# Patient Record
Sex: Female | Born: 1965 | Race: White | Hispanic: No | Marital: Single
Health system: Southern US, Community
[De-identification: ages and names within clinical notes are randomized; demographics above are authoritative.]

---

## 2019-10-13 ENCOUNTER — Emergency Department (HOSPITAL_COMMUNITY)
Admission: EM | Admit: 2019-10-13 | Discharge: 2019-10-13 | Disposition: A | Discharge status: Home / Self Care | Payer: Medicaid - Out of State | Attending: Emergency Medicine | Admitting: Emergency Medicine

## 2019-10-13 ENCOUNTER — Emergency Department (HOSPITAL_COMMUNITY): Payer: Medicaid - Out of State

## 2019-10-13 ENCOUNTER — Encounter (HOSPITAL_COMMUNITY): Payer: Self-pay | Admitting: Student

## 2019-10-13 ENCOUNTER — Other Ambulatory Visit: Payer: Self-pay

## 2019-10-13 DIAGNOSIS — Q612 Polycystic kidney, adult type: Secondary | ICD-10-CM | POA: Diagnosis not present

## 2019-10-13 DIAGNOSIS — I2510 Atherosclerotic heart disease of native coronary artery without angina pectoris: Secondary | ICD-10-CM | POA: Insufficient documentation

## 2019-10-13 DIAGNOSIS — R079 Chest pain, unspecified: Secondary | ICD-10-CM

## 2019-10-13 DIAGNOSIS — R0789 Other chest pain: Secondary | ICD-10-CM | POA: Diagnosis not present

## 2019-10-13 LAB — CBC
HCT: 45.6 % (ref 36.0–46.0)
Hemoglobin: 15.1 g/dL — ABNORMAL HIGH (ref 12.0–15.0)
MCH: 31.3 pg (ref 26.0–34.0)
MCHC: 33.1 g/dL (ref 30.0–36.0)
MCV: 94.6 fL (ref 80.0–100.0)
Platelets: 158 10*3/uL (ref 150–400)
RBC: 4.82 MIL/uL (ref 3.87–5.11)
RDW: 14.1 % (ref 11.5–15.5)
WBC: 5.4 10*3/uL (ref 4.0–10.5)
nRBC: 0 % (ref 0.0–0.2)

## 2019-10-13 LAB — BASIC METABOLIC PANEL
Anion gap: 10 (ref 5–15)
BUN: 22 mg/dL — ABNORMAL HIGH (ref 6–20)
CO2: 25 mmol/L (ref 22–32)
Calcium: 9.8 mg/dL (ref 8.9–10.3)
Chloride: 106 mmol/L (ref 98–111)
Creatinine, Ser: 0.99 mg/dL (ref 0.44–1.00)
Glucose, Bld: 116 mg/dL — ABNORMAL HIGH (ref 70–99)
Potassium: 3.9 mmol/L (ref 3.5–5.1)
Sodium: 141 mmol/L (ref 135–145)

## 2019-10-13 LAB — I-STAT BETA HCG BLOOD, ED (MC, WL, AP ONLY)
I-stat hCG, quantitative: 18.1 m[IU]/mL — ABNORMAL HIGH (ref <5)
I-stat hCG, quantitative: 24.6 m[IU]/mL — ABNORMAL HIGH (ref <5)

## 2019-10-13 LAB — TROPONIN I (HIGH SENSITIVITY)
Troponin I (High Sensitivity): 3 ng/L (ref <18)
Troponin I (High Sensitivity): 3 ng/L (ref <18)

## 2019-10-13 MED ORDER — SODIUM CHLORIDE 0.9% FLUSH: 3.0000 mL | Freq: Once | INTRAVENOUS | Status: DC

## 2019-10-13 MED ORDER — METHOCARBAMOL 500 MG PO TABS
500.0000 mg | ORAL_TABLET | Freq: Three times a day (TID) | ORAL | 0 refills | Status: AC | PRN
Start: 2019-10-13 — End: ?

## 2019-10-13 MED ORDER — LIDOCAINE 5 % EX PTCH: 1.0000 | MEDICATED_PATCH | Freq: Every day | CUTANEOUS | 0 refills | Status: AC | PRN

## 2019-10-13 NOTE — ED Triage Notes (Addendum)
Pt presents to ED BIB [facility]. Pt c/o L CP that radiates to L arm. Pt reports that pain began at rest . Pt reports long trip on bus recently. EMS VSS, EMS gave 324 ASA and nitro x1. Pt reports she had "mild" heart attack in 2016.

## 2019-10-13 NOTE — Discharge Instructions (Addendum)
You were seen in the emergency department today for chest pain. Your work-up in the emergency department has been overall reassuring. Your labs have been fairly normal, your hcg was mildly elevated this can occur for a variety of reasons, this can be in pregnancy but we doubt this given your age at this time, please have this rechecked by your primary care provider. Your EKG and the enzyme we use to check your heart did not show an acute heart attack at this time. Your chest x-ray was normal.   We are sending you home with the following medicines:   - Naproxen is a nonsteroidal anti-inflammatory medication that will help with pain and swelling. Be sure to take this medication as prescribed with food, 1 pill every 12 hours,  It should be taken with food, as it can cause stomach upset, and more seriously, stomach bleeding. Do not take other nonsteroidal anti-inflammatory medications with this such as Advil, Motrin, Aleve, Mobic, Goodie Powder, or Motrin.    - Lidoderm patch- apply 1 patch to your area of most significant pain once per day, remove within 12 hours.  You make take Tylenol per over the counter dosing with these medications.   We have prescribed you new medication(s) today. Discuss the medications prescribed today with your pharmacist as they can have adverse effects and interactions with your other medicines including over the counter and prescribed medications. Seek medical evaluation if you start to experience new or abnormal symptoms after taking one of these medicines, seek care immediately if you start to experience difficulty breathing, feeling of your throat closing, facial swelling, or rash as these could be indications of a more serious allergic reaction   We would like you to follow up closely with your primary care provider and/or the cardiologist provided in your discharge instructions within 1-3 days. Return to the ER immediately should you experience any new or worsening symptoms  including but not limited to return of pain, worsened pain, vomiting, shortness of breath, dizziness, lightheadedness, passing out, or any other concerns that you may have.

## 2019-10-13 NOTE — ED Notes (Signed)
Pt pulled back for serial enzymes. Reassessed. States her pain is better but remains in left chest. Pinpoint pain. Not radiating. No nausea or vomiting. Skin w/d. Resp even and unlabored.

## 2019-10-13 NOTE — ED Provider Notes (Signed)
[facility] EMERGENCY DEPARTMENT Provider Note   CSN: [id number] Arrival date & time: 10/13/19  0133     History Chief Complaint  Patient presents with  . Chest Pain    Patricia Wolf is a 54 y.o. female with a history of CAD & polycystic kidney disease who presents to the ED with complaints of chest pain that began at midnight. Patient states pain is to the L chest, it is sharp/stabbing, with radiating into the L shoulder. Occurs intermittently, lasts a few minutes at a time. No alleviating/aggravating factors, no change with deep breath or exertion. Given Aspirin & NTG by EMS without much change. Denies nausea, vomiting, diaphoresis, syncope, dyspnea, leg pain/swelling, hemoptysis, recent surgery/trauma, hormone use, personal hx of cancer, or hx of DVT/PE. She was just on a bus for about 20 hours from [place] to [place]. States she had a "mini heart attack" in 2016, did not require stent placement.   HPI     No past medical history on file.  There are no problems to display for this patient.   History reviewed. No pertinent surgical history.   OB History   No obstetric history on file.     No family history on file.  Social History   Tobacco Use  . Smoking status: Not on file  Substance Use Topics  . Alcohol use: Not on file  . Drug use: Not on file    Home Medications Prior to Admission medications   Not on File    Allergies    Motrin [ibuprofen] and Tegretol [carbamazepine]  Review of Systems   Review of Systems  Constitutional: Negative for chills, diaphoresis and fever.  Respiratory: Negative for shortness of breath.   Cardiovascular: Positive for chest pain. Negative for palpitations and leg swelling.  Gastrointestinal: Negative for abdominal pain, nausea and vomiting.  Neurological: Negative for dizziness, speech difficulty, weakness and numbness.  All other systems reviewed and are negative.   Physical Exam Updated Vital Signs BP 125/89 (BP  Location: Right Arm)   Pulse 82   Temp 98.8 F (37.1 C) (Oral)   Resp 13   Ht 5\' 3"  (1.6 m)   Wt 77.1 kg   SpO2 99%   BMI 30.11 kg/m   Physical Exam Vitals and nursing note reviewed.  Constitutional:      General: She is not in acute distress.    Appearance: She is well-developed. She is not toxic-appearing.  HENT:     Head: Normocephalic and atraumatic.  Eyes:     General:        Right eye: No discharge.        Left eye: No discharge.     Conjunctiva/sclera: Conjunctivae normal.  Cardiovascular:     Rate and Rhythm: Normal rate and regular rhythm.     Pulses:          Radial pulses are 2+ on the right side and 2+ on the left side.       Dorsalis pedis pulses are 2+ on the right side and 2+ on the left side.       Posterior tibial pulses are 2+ on the right side and 2+ on the left side.  Pulmonary:     Effort: Pulmonary effort is normal. No respiratory distress.     Breath sounds: Normal breath sounds. No wheezing, rhonchi or rales.  Chest:     Chest wall: Tenderness (Left chest wall, reproduces patient's chest pain, no overlying skin changes.) present.  Abdominal:  General: There is no distension.     Palpations: Abdomen is soft.     Tenderness: There is no abdominal tenderness.  Musculoskeletal:     Cervical back: Neck supple.     Right lower leg: No tenderness. No edema.     Left lower leg: No tenderness. No edema.     Comments: Upper extremities: No edema, erythema, or warmth.  Intact active range of motion, patient has pain in her left chest with left shoulder flexion which reproduces her chest discomfort.  No focal bony tenderness.  Compartments are soft.  Skin:    General: Skin is warm and dry.     Findings: No rash.  Neurological:     Mental Status: She is alert.     Comments: Clear speech.   Psychiatric:        Behavior: Behavior normal.    ED Results / Procedures / Treatments   Labs (all labs ordered are listed, but only abnormal results are  displayed) Labs Reviewed  BASIC METABOLIC PANEL - Abnormal; Notable for the following components:      Result Value   Glucose, Bld 116 (*)    BUN 22 (*)    All other components within normal limits  CBC - Abnormal; Notable for the following components:   Hemoglobin 15.1 (*)    All other components within normal limits  I-STAT BETA HCG BLOOD, ED ([facility], [facility], [facility] ONLY) - Abnormal; Notable for the following components:   I-stat hCG, quantitative 18.1 (*)    All other components within normal limits  I-STAT BETA HCG BLOOD, ED ([facility], [facility], [facility] ONLY) - Abnormal; Notable for the following components:   I-stat hCG, quantitative 24.6 (*)    All other components within normal limits  TROPONIN I (HIGH SENSITIVITY)  TROPONIN I (HIGH SENSITIVITY)    EKG EKG Interpretation  Date/Time:  Thursday October 13 2019 01:44:52 EDT Ventricular Rate:  94 PR Interval:  120 QRS Duration: 78 QT Interval:  352 QTC Calculation: 440 R Axis:   34 Text Interpretation: Normal sinus rhythm Nonspecific T wave abnormality Abnormal ECG agree, no old comparison Confirmed by Arby Barrette [phone]) on 10/13/2019 8:12:13 AM   Radiology DG Chest 2 View  Result Date: 10/13/2019 CLINICAL DATA:  Chest pain EXAM: CHEST - 2 VIEW COMPARISON:  None. FINDINGS: The heart size and mediastinal contours are within normal limits. Both lungs are clear. The visualized skeletal structures are unremarkable. IMPRESSION: No active cardiopulmonary disease. Electronically Signed   By: Burman Nieves M.D.   On: 10/13/2019 05:03    Procedures Procedures (including critical care time)  Medications Ordered in ED Medications  sodium chloride flush (NS) 0.9 % injection 3 mL (has no administration in time range)    ED Course  I have reviewed the triage vital signs and the nursing notes.  Pertinent labs & imaging results that were available during my care of the patient were reviewed by me and considered in my medical decision making (see chart  for details).    MDM Rules/Calculators/A&P                         Patient presents to the ED with complaints of chest pain which has been occurring intermittently since midnight.  Patient is nontoxic, resting comfortably, her vitals are within normal limits.  On exam her chest pain is reproducible with left chest wall palpation anteriorly as well as with movement of the left upper extremity.  DDx:  ACS, pulmonary embolism, dissection, pneumothorax, pneumonia, GERD, musculoskeletal pain, anxiety, critical anemia.  Additional history obtained:  Additional history obtained from nursing note review.  EKG: Nonspecific T wave changes, no STEMI, no prior on record for comparison. Lab Tests:  I reviewed and interpreted labs, which included:  CBC: No significant anemia. BMP: No significant electrolyte derangement. Elevated i-STAT beta-hCG, do not suspect the patient is pregnant. Troponins: No significant elevation, flat  Imaging Studies ordered:  Chest x-ray ordered per triage team, I independently visualized and interpreted imaging, I am in agreement with radiologist impression of no active cardiopulmonary disease.  HEAR score 4- EKG no STEMI, nonspecific T wave change on EKG but no prior on record for comparison, delta troponin without significant elevation, we discussed option of 3rd troponin in the ED, however patient would like to go home, she is currently chest pain free unless we palpate her chest, given her pain is reproducible with palpation and her delta troponin is reassuring I have a low suspicion for ACS. Patient is low risk wells, patient not interested in staying for d-dimer, low suspicion for pulmonary embolism. Pain is not a tearing sensation, symmetric pulses, no widening of mediastinum on CXR, doubt dissection.  CXR & labs overall reassuring. Patient has appeared hemodynamically stable throughout ER visit and appears safe for discharge with close PCP/cardiology follow up Will trial  muscle relaxant & lidoderm patch for pain. I discussed results, treatment plan, need for PCP/cardiology follow-up, and return precautions with the patient. Provided opportunity for questions, patient confirmed understanding and is in agreement with plan.   Findings and plan of care discussed with supervising physician Dr. Donnald Garre who is in agreement.   Portions of this note were generated with Scientist, clinical (histocompatibility and immunogenetics). Dictation errors may occur despite best attempts at proofreading.  Final Clinical Impression(s) / ED Diagnoses Final diagnoses:  Chest pain, unspecified type    Rx / DC Orders ED Discharge Orders         Ordered    methocarbamol (ROBAXIN) 500 MG tablet  Every 8 hours PRN     Discontinue  Reprint     10/13/19 0807    lidocaine (LIDODERM) 5 %  Daily PRN     Discontinue  Reprint     10/13/19 0807           Reylynn Vanalstine, Pleas Koch, PA-C 10/13/19 [id number]    Arby Barrette, MD 10/14/19 1539

## 2021-03-26 IMAGING — CR DG CHEST 2V
2 series · 2 of 2 positions shown · non-contrast
Comparison: None.

CLINICAL DATA: Chest pain

EXAM:
CHEST - 2 VIEW

[chest pa]
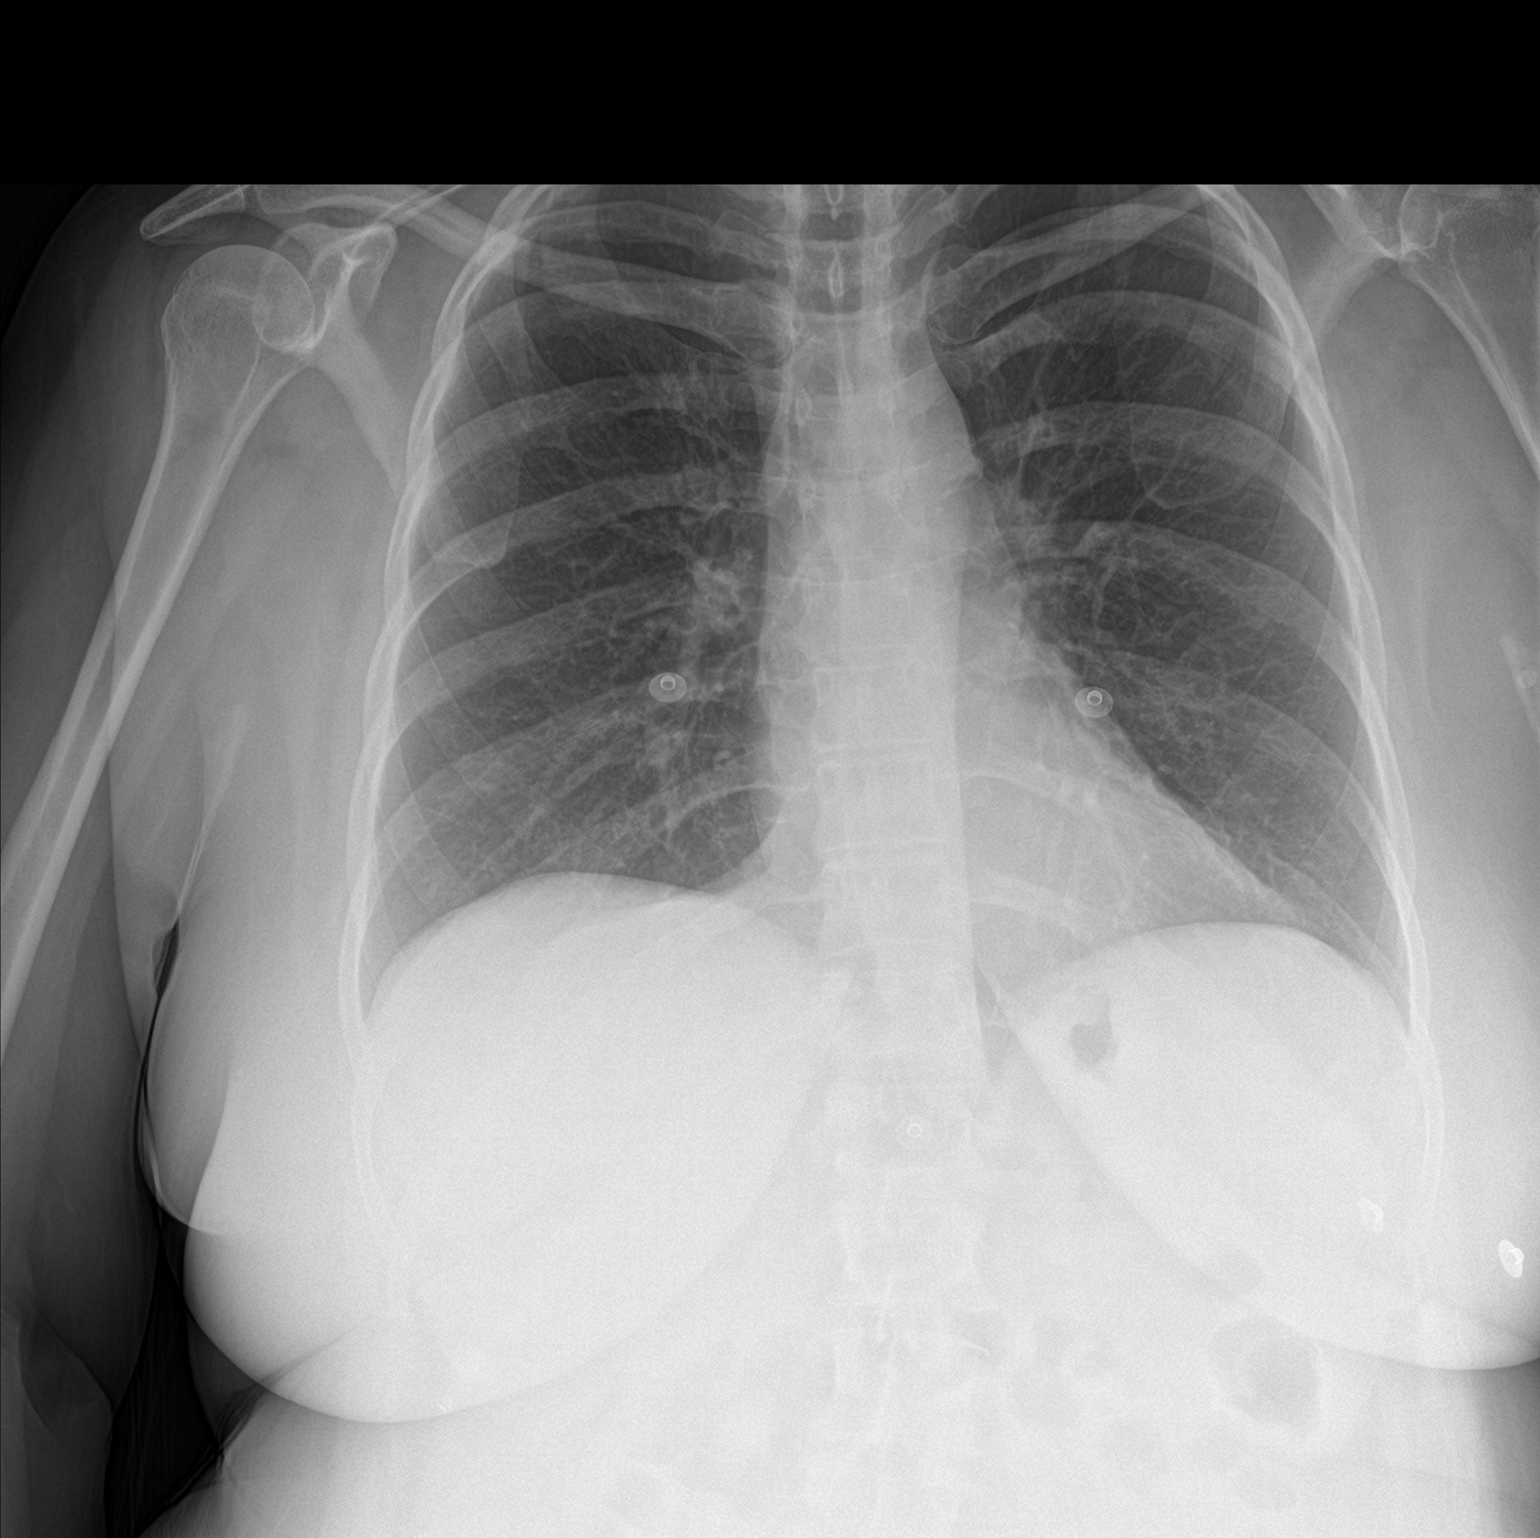

[chest lat]
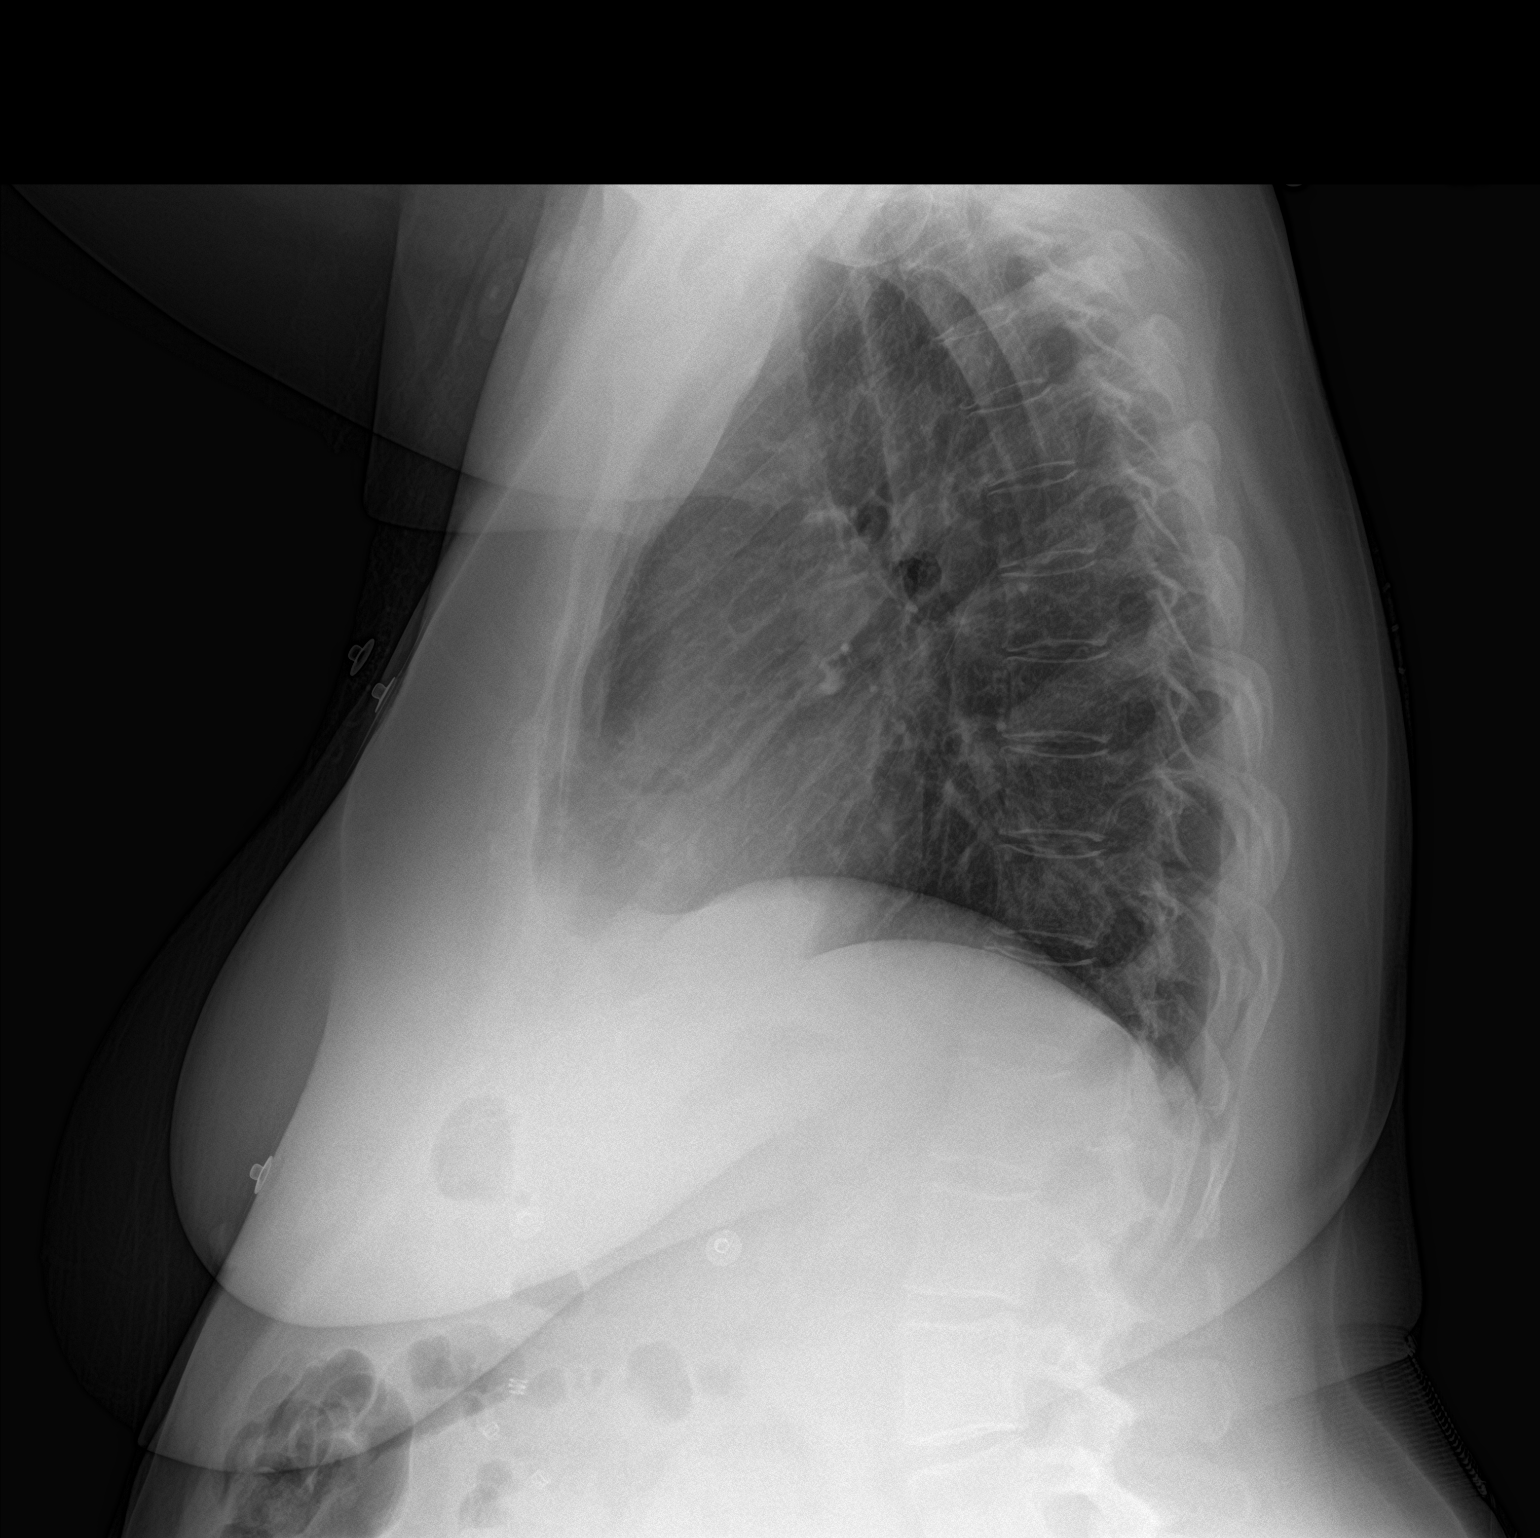

[2 of 2 positions shown; findings below may reference images not displayed]

FINDINGS: The heart size and mediastinal contours are within normal limits.
Both lungs are clear. The visualized skeletal structures are
unremarkable.
IMPRESSION: No active cardiopulmonary disease.
